# Patient Record
Sex: Male | Born: 1975 | Race: White | Hispanic: No | Marital: Married | Smoking: Former smoker
Health system: Southern US, Community
[De-identification: ages and names within clinical notes are randomized; demographics above are authoritative.]

## PROBLEM LIST (undated history)

## (undated) DIAGNOSIS — K219 Gastro-esophageal reflux disease without esophagitis: Secondary | ICD-10-CM

## (undated) DIAGNOSIS — K5792 Diverticulitis of intestine, part unspecified, without perforation or abscess without bleeding: Secondary | ICD-10-CM

## (undated) DIAGNOSIS — J309 Allergic rhinitis, unspecified: Secondary | ICD-10-CM

## (undated) DIAGNOSIS — R5383 Other fatigue: Secondary | ICD-10-CM

## (undated) DIAGNOSIS — R51 Headache: Secondary | ICD-10-CM

## (undated) DIAGNOSIS — F419 Anxiety disorder, unspecified: Secondary | ICD-10-CM

## (undated) DIAGNOSIS — R519 Headache, unspecified: Secondary | ICD-10-CM

## (undated) HISTORY — PX: VASECTOMY: SHX75

## (undated) HISTORY — DX: Allergic rhinitis, unspecified: J30.9

## (undated) HISTORY — DX: Headache: R51

## (undated) HISTORY — DX: Anxiety disorder, unspecified: F41.9

## (undated) HISTORY — DX: Gastro-esophageal reflux disease without esophagitis: K21.9

## (undated) HISTORY — DX: Headache, unspecified: R51.9

## (undated) HISTORY — DX: Diverticulitis of intestine, part unspecified, without perforation or abscess without bleeding: K57.92

## (undated) HISTORY — DX: Other fatigue: R53.83

---

## 2006-12-18 ENCOUNTER — Emergency Department (HOSPITAL_COMMUNITY): Admission: EM | Admit: 2006-12-18 | Discharge: 2006-12-18 | Payer: Self-pay | Admitting: Emergency Medicine

## 2007-07-01 ENCOUNTER — Ambulatory Visit (HOSPITAL_COMMUNITY): Admission: RE | Admit: 2007-07-01 | Discharge: 2007-07-01 | Payer: Self-pay | Admitting: Internal Medicine

## 2008-07-27 ENCOUNTER — Encounter: Admission: RE | Admit: 2008-07-27 | Discharge: 2008-07-27 | Payer: Self-pay | Admitting: Allergy and Immunology

## 2011-04-30 LAB — POCT CARDIAC MARKERS
CKMB, poc: <1 — ABNORMAL LOW
Myoglobin, poc: 50.9
Operator id: 151321

## 2011-04-30 LAB — I-STAT 8, (EC8 V) (CONVERTED LAB)
Bicarbonate: 29.6 — ABNORMAL HIGH
Glucose, Bld: 108 — ABNORMAL HIGH
Hemoglobin: 17.7 — ABNORMAL HIGH
Operator id: 151321
Sodium: 140
TCO2: 31
pCO2, Ven: 51.6 — ABNORMAL HIGH

## 2011-08-14 ENCOUNTER — Other Ambulatory Visit: Payer: Self-pay | Admitting: Occupational Medicine

## 2011-08-14 ENCOUNTER — Ambulatory Visit
Admission: RE | Admit: 2011-08-14 | Discharge: 2011-08-14 | Disposition: A | Discharge status: Home / Self Care | Payer: No Typology Code available for payment source | Source: Ambulatory Visit | Attending: Occupational Medicine | Admitting: Occupational Medicine

## 2011-08-14 DIAGNOSIS — M25561 Pain in right knee: Secondary | ICD-10-CM

## 2013-05-22 ENCOUNTER — Encounter: Payer: Self-pay | Admitting: Gastroenterology

## 2013-06-23 ENCOUNTER — Encounter: Payer: Self-pay | Admitting: Gastroenterology

## 2013-06-23 ENCOUNTER — Ambulatory Visit (INDEPENDENT_AMBULATORY_CARE_PROVIDER_SITE_OTHER): Payer: 59 | Admitting: Gastroenterology

## 2013-06-23 VITALS — BP 100/70 | HR 80 | Ht 69.0 in | Wt 156.2 lb

## 2013-06-23 DIAGNOSIS — K921 Melena: Secondary | ICD-10-CM

## 2013-06-23 DIAGNOSIS — K219 Gastro-esophageal reflux disease without esophagitis: Secondary | ICD-10-CM

## 2013-06-23 DIAGNOSIS — R1032 Left lower quadrant pain: Secondary | ICD-10-CM

## 2013-06-23 MED ORDER — PEG-KCL-NACL-NASULF-NA ASC-C 100 G PO SOLR: 1.0000 | Freq: Once | ORAL | Status: DC

## 2013-06-23 NOTE — Progress Notes (Signed)
    History of Present Illness: This is a 37 year old white male who relates the onset of severe left lower quadrant pain in October. He was treated with a course of Cipro and Flagyl and his pain resolved. Following that he had an episode of clots per rectum and has small amounts of bright red blood at other times bowel movements. Office records from Dr. Wylene Simmer were reviewed including a CBC at the time of his acute lower quadrant pain showing white blood cell count of 14.9 and a hemoglobin of 15.8. Patient has occasional loose stools when he takes dexlansoprazole more frequently. He has been treated for  GERD for approximately 10 years. Denies weight loss, constipation, change in stool caliber, melena, hematochezia, nausea, vomiting, dysphagia, chest pain.  No Known Allergies No outpatient prescriptions prior to visit.   No facility-administered medications prior to visit.   Past Medical History  Diagnosis Date  . GERD (gastroesophageal reflux disease)   . AR (allergic rhinitis)   . Anxiety   . Diverticulitis   . Fatigue   . Severe frontal headaches    Past Surgical History  Procedure Laterality Date  . Vasectomy     History   Social History  . Marital Status: Married    Spouse Name: N/A    Number of Children: 2  . Years of Education: N/A   Occupational History  . Maintenence Worker Unemployed   Social History Main Topics  . Smoking status: Former Smoker    Quit date: 07/13/2006  . Smokeless tobacco: Never Used  . Alcohol Use: No  . Drug Use: No  . Sexual Activity: None   Other Topics Concern  . None   Social History Narrative  . None   Family History  Problem Relation Age of Onset  . Diverticulitis Mother     19's  . Lung cancer Maternal Grandmother   . Melanoma Maternal Grandfather     Review of Systems: Pertinent positive and negative review of systems were noted in the above HPI section. All other review of systems were otherwise negative.   Physical  Exam: General: Well developed , well nourished, no acute distress Head: Normocephalic and atraumatic Eyes:  sclerae anicteric, EOMI Ears: Normal auditory acuity Mouth: No deformity or lesions Neck: Supple, no masses or thyromegaly Lungs: Clear throughout to auscultation Heart: Regular rate and rhythm; no murmurs, rubs or bruits Abdomen: Soft, non tender and non distended. No masses, hepatosplenomegaly or hernias noted. Normal Bowel sounds Rectal: Deferred to colonoscopy  Musculoskeletal: Symmetrical with no gross deformities  Skin: No lesions on visible extremities Pulses:  Normal pulses noted Extremities: No clubbing, cyanosis, edema or deformities noted Neurological: Alert oriented x 4, grossly nonfocal Cervical Nodes:  No significant cervical adenopathy Inguinal Nodes: No significant inguinal adenopathy Psychological:  Alert and cooperative. Normal mood and affect  Assessment and Recommendations:  1. Presumed episode of diverticulitis in late October which responded well to a course of Cipro and Flagyl. Schedule colonoscopy.  2.  Hematochezia. Rule out hemorrhoids, diverticulosis, colorectal neoplasms.  The risks, benefits, and alternatives to colonoscopy with possible biopsy and possible polypectomy were discussed with the patient and they consent to proceed.   3. GERD. Standard antireflux measures and continue dexlansoprazole. Although he has diarrhea that seems to be related to dexlansoprazole He prefers not to change to another PPI as others have not been effective.

## 2013-06-23 NOTE — Patient Instructions (Addendum)
You have been scheduled for a colonoscopy with propofol. Please follow written instructions given to you at your visit today.  Please pick up your prep kit at the pharmacy within the next 1-3 days. If you use inhalers (even only as needed), please bring them with you on the day of your procedure.  Thank you for choosing me and [facility].  Venita Lick. Pleas Koch., MD., Clementeen Graham  cc: Guerry Bruin, MD

## 2013-08-08 ENCOUNTER — Encounter: Payer: Self-pay | Admitting: Gastroenterology

## 2013-08-08 ENCOUNTER — Ambulatory Visit (AMBULATORY_SURGERY_CENTER): Payer: 59 | Admitting: Gastroenterology

## 2013-08-08 VITALS — BP 114/66 | HR 60 | Temp 98.1°F | Resp 12 | Ht 69.0 in | Wt 156.0 lb

## 2013-08-08 DIAGNOSIS — D126 Benign neoplasm of colon, unspecified: Secondary | ICD-10-CM

## 2013-08-08 DIAGNOSIS — K921 Melena: Secondary | ICD-10-CM

## 2013-08-08 MED ORDER — SODIUM CHLORIDE 0.9 % IV SOLN: 500.0000 mL | INTRAVENOUS | Status: DC

## 2013-08-08 NOTE — Patient Instructions (Signed)

## 2013-08-08 NOTE — Progress Notes (Signed)
Called to room to assist during endoscopic procedure.  Patient ID and intended procedure confirmed with present staff. Received instructions for my participation in the procedure from the performing physician.  

## 2013-08-08 NOTE — Op Note (Signed)
[facility]  [address] [place], [id number]   COLONOSCOPY PROCEDURE REPORT  PATIENT: [name], Justin  MR#: [id number] BIRTHDATE: [birth year] , 37  yrs. old GENDER: Male ENDOSCOPIST: Ladene Artist, MD, [facility] REFERRED [id number] Tisovec, M.D. PROCEDURE DATE:  08/08/2013 PROCEDURE:   Colonoscopy with snare polypectomy First Screening Colonoscopy - Avg.  risk and is 50 yrs.  old or older - No.  Prior Negative Screening - Now for repeat screening. N/A  History of Adenoma - Now for follow-up colonoscopy & has been > or = to 3 yrs.  N/A  Polyps Removed Today? Yes. ASA CLASS:   Class II INDICATIONS:hematochezia and abdominal pain in the lower left quadrant. MEDICATIONS: MAC sedation, administered by CRNA and propofol (Diprivan) 350mg  IV DESCRIPTION OF PROCEDURE:   After the risks benefits and alternatives of the procedure were thoroughly explained, informed consent was obtained.  A digital rectal exam revealed no abnormalities of the rectum.   The LB [id number]  endoscope was introduced through the anus and advanced to the terminal ileum which was intubated for a short distance. No adverse events experienced.   The quality of the prep was good, using MoviPrep The instrument was then slowly withdrawn as the colon was fully examined.  COLON FINDINGS: A sessile polyp measuring 5 mm in size was found in the transverse colon.  A polypectomy was performed with a cold snare.  The resection was complete and the polyp tissue was completely retrieved.   The mucosa appeared normal in the terminal ileum.   The colon was otherwise normal.  There was no diverticulosis, inflammation, polyps or cancers unless previously stated.  Retroflexed views revealed small internal hemorrhoids. The time to cecum=1 minutes 02 seconds.  Withdrawal time=10 minutes 16 seconds.  The scope was withdrawn and the procedure completed. COMPLICATIONS: There were no complications.  ENDOSCOPIC  IMPRESSION: 1.   Sessile polyp measuring 5 mm in the transverse colon; polypectomy performed with a cold snare 2.   Normal mucosa in the terminal ileum 3.   Small internal hemorrhoids  RECOMMENDATIONS: 1. Repeat colonoscopy in 5 years if polyp adenomatous; otherwise at age 61 years for routine CRC screening 2.  Prior illness likely a self limited colitis-suspected infectious  eSigned:  Ladene Artist, MD, [facility] 08/08/2013 3:35 PM

## 2013-08-09 ENCOUNTER — Telehealth: Payer: Self-pay | Admitting: *Deleted

## 2013-08-09 NOTE — Telephone Encounter (Signed)
  Follow up Call-  Call back number 08/08/2013  Post procedure Call Back phone  # [phone]  Permission to leave phone message Yes     Patient questions:  Do you have a fever, pain , or abdominal swelling? no Pain Score  0 *  Have you tolerated food without any problems? yes  Have you been able to return to your normal activities? yes  Do you have any questions about your discharge instructions: Diet   no Medications  no Follow up visit  no  Do you have questions or concerns about your Care? no  Actions: * If pain score is 4 or above: No action needed, pain <4.

## 2013-08-16 ENCOUNTER — Encounter: Payer: Self-pay | Admitting: Gastroenterology

## 2016-08-21 DIAGNOSIS — K130 Diseases of lips: Secondary | ICD-10-CM | POA: Diagnosis not present

## 2017-06-01 DIAGNOSIS — Z0000 Encounter for general adult medical examination without abnormal findings: Secondary | ICD-10-CM | POA: Diagnosis not present

## 2017-06-01 DIAGNOSIS — Z125 Encounter for screening for malignant neoplasm of prostate: Secondary | ICD-10-CM | POA: Diagnosis not present

## 2017-06-11 DIAGNOSIS — K219 Gastro-esophageal reflux disease without esophagitis: Secondary | ICD-10-CM | POA: Diagnosis not present

## 2017-06-11 DIAGNOSIS — J4520 Mild intermittent asthma, uncomplicated: Secondary | ICD-10-CM | POA: Diagnosis not present

## 2017-06-11 DIAGNOSIS — Z0000 Encounter for general adult medical examination without abnormal findings: Secondary | ICD-10-CM | POA: Diagnosis not present

## 2017-06-11 DIAGNOSIS — Z1389 Encounter for screening for other disorder: Secondary | ICD-10-CM | POA: Diagnosis not present

## 2017-10-09 ENCOUNTER — Emergency Department (HOSPITAL_COMMUNITY)
Admission: EM | Admit: 2017-10-09 | Discharge: 2017-10-09 | Disposition: A | Discharge status: Home / Self Care | Payer: 59 | Attending: Emergency Medicine | Admitting: Emergency Medicine

## 2017-10-09 ENCOUNTER — Encounter (HOSPITAL_COMMUNITY): Payer: Self-pay | Admitting: Emergency Medicine

## 2017-10-09 ENCOUNTER — Emergency Department (HOSPITAL_COMMUNITY): Payer: 59

## 2017-10-09 ENCOUNTER — Other Ambulatory Visit: Payer: Self-pay

## 2017-10-09 DIAGNOSIS — M79642 Pain in left hand: Secondary | ICD-10-CM | POA: Diagnosis not present

## 2017-10-09 DIAGNOSIS — Y9389 Activity, other specified: Secondary | ICD-10-CM | POA: Insufficient documentation

## 2017-10-09 DIAGNOSIS — S6982XA Other specified injuries of left wrist, hand and finger(s), initial encounter: Secondary | ICD-10-CM | POA: Diagnosis not present

## 2017-10-09 DIAGNOSIS — F419 Anxiety disorder, unspecified: Secondary | ICD-10-CM | POA: Insufficient documentation

## 2017-10-09 DIAGNOSIS — Y92009 Unspecified place in unspecified non-institutional (private) residence as the place of occurrence of the external cause: Secondary | ICD-10-CM | POA: Diagnosis not present

## 2017-10-09 DIAGNOSIS — T148XXA Other injury of unspecified body region, initial encounter: Secondary | ICD-10-CM | POA: Diagnosis not present

## 2017-10-09 DIAGNOSIS — Z79899 Other long term (current) drug therapy: Secondary | ICD-10-CM | POA: Diagnosis not present

## 2017-10-09 DIAGNOSIS — Z87891 Personal history of nicotine dependence: Secondary | ICD-10-CM | POA: Diagnosis not present

## 2017-10-09 DIAGNOSIS — Y998 Other external cause status: Secondary | ICD-10-CM | POA: Diagnosis not present

## 2017-10-09 DIAGNOSIS — Z23 Encounter for immunization: Secondary | ICD-10-CM | POA: Insufficient documentation

## 2017-10-09 DIAGNOSIS — W3189XA Contact with other specified machinery, initial encounter: Secondary | ICD-10-CM | POA: Diagnosis not present

## 2017-10-09 DIAGNOSIS — M79641 Pain in right hand: Secondary | ICD-10-CM | POA: Diagnosis not present

## 2017-10-09 DIAGNOSIS — T704XXA Effects of high-pressure fluids, initial encounter: Secondary | ICD-10-CM | POA: Diagnosis not present

## 2017-10-09 DIAGNOSIS — W298XXA Contact with other powered powered hand tools and household machinery, initial encounter: Secondary | ICD-10-CM

## 2017-10-09 DIAGNOSIS — S61211A Laceration without foreign body of left index finger without damage to nail, initial encounter: Secondary | ICD-10-CM | POA: Insufficient documentation

## 2017-10-09 MED ORDER — HYDROCODONE-ACETAMINOPHEN 5-325 MG PO TABS: 2.0000 | ORAL_TABLET | ORAL | 0 refills | Status: DC | PRN

## 2017-10-09 MED ORDER — AMOXICILLIN-POT CLAVULANATE 875-125 MG PO TABS: 1.0000 | ORAL_TABLET | Freq: Two times a day (BID) | ORAL | 0 refills | Status: DC

## 2017-10-09 MED ORDER — MORPHINE SULFATE (PF) 4 MG/ML IV SOLN
2.0000 mg | Freq: Once | INTRAVENOUS | Status: AC
  Administered 2017-10-09: 2 mg via INTRAVENOUS
  Filled 2017-10-09: qty 1

## 2017-10-09 MED ORDER — TETANUS-DIPHTH-ACELL PERTUSSIS 5-2.5-18.5 LF-MCG/0.5 IM SUSP
0.5000 mL | Freq: Once | INTRAMUSCULAR | Status: AC
  Administered 2017-10-09: 0.5 mL via INTRAMUSCULAR
  Filled 2017-10-09: qty 0.5

## 2017-10-09 NOTE — ED Notes (Signed)
ED Provider at bedside. 

## 2017-10-09 NOTE — Discharge Instructions (Signed)
Please return for any problem.  Keep wound clean and dry.  Take antibiotics and pain medication as prescribed.  Follow-up with Dr. Fredna Dow as instructed.

## 2017-10-09 NOTE — ED Provider Notes (Addendum)
[facility] EMERGENCY DEPARTMENT Provider Note   CSN: [id number] Arrival date & time: 10/09/17  1545     History   Chief Complaint Chief Complaint  Patient presents with  . Laceration    HPI Justin [name] is a 42 y.o. male.  42 year old male without significant prior medical history presents with complaint of laceration to the left index finger.  Patient reports that he was attempting to change the tips on his home pressure washer when the pressure washer triggered.  He has an injection injury to the base of the left index finger.  Reports that the pressure washer only had water running through it.  There was no detergents added to the flow.  He reports a very brief exposure to the pressure.  He denies current pain.  He is unsure of his last tetanus.  He received a dose of pain medication  enroute by EMS.  He denies numbness or tingling to the left hand.  There is no active bleeding at the time of my evaluation.  The history is provided by the patient.  Laceration   The incident occurred less than 1 hour ago. Pain location: Palmar aspect at the base of the left index finger. Size: 0.5cm. Injury mechanism: Home pressure washer. The pain is at a severity of 0/10. The patient is experiencing no pain. The pain has been improving since onset. His tetanus status is unknown.    Past Medical History:  Diagnosis Date  . Anxiety   . AR (allergic rhinitis)   . Diverticulitis   . Fatigue   . GERD (gastroesophageal reflux disease)   . Severe frontal headaches     There are no active problems to display for this patient.   Past Surgical History:  Procedure Laterality Date  . VASECTOMY          Home Medications    Prior to Admission medications   Medication Sig Start Date End Date Taking? Authorizing Provider  Budesonide-Formoterol Fumarate (SYMBICORT IN) Inhale into the lungs.    [provider]  DEXILANT 60 MG capsule  05/12/13   [provider]    Family History Family History  Problem Relation Age of Onset  . Diverticulitis Mother        65's  . Lung cancer Maternal Grandmother   . Melanoma Maternal Grandfather     Social History Social History   Tobacco Use  . Smoking status: Former Smoker    Last attempt to quit: 07/13/2006    Years since quitting: 11.2  . Smokeless tobacco: Never Used  Substance Use Topics  . Alcohol use: No  . Drug use: No     Allergies   Patient has no known allergies.   Review of Systems Review of Systems  Skin:       Laceration  All other systems reviewed and are negative.    Physical Exam Updated Vital Signs SpO2 99%   Physical Exam  Constitutional: He is oriented to person, place, and time. He appears well-developed and well-nourished. No distress.  HENT:  Head: Normocephalic and atraumatic.  Mouth/Throat: Oropharynx is clear and moist.  Eyes: Pupils are equal, round, and reactive to light. Conjunctivae and EOM are normal.  Neck: Normal range of motion. Neck supple.  Cardiovascular: Normal rate, regular rhythm and normal heart sounds.  Pulmonary/Chest: Effort normal and breath sounds normal. No respiratory distress.  Abdominal: Soft. He exhibits no distension. There is no tenderness.  Musculoskeletal: Normal range of motion. He  exhibits no edema or deformity.  0.5 cm laceration to the base of the left index finger along the palmar aspect. Distal right upper extremity is neurovascularly intact.  No active bleeding noted from laceration.  No appreciable foreign body. Mild crepitus noted around the left index finger and dorsal aspect of the left hand.   Please see attached photo.  Neurological: He is alert and oriented to person, place, and time.  Skin: Skin is warm and dry.  Nursing note and vitals reviewed.         ED Treatments / Results  Labs (all labs ordered are listed, but only abnormal results are displayed) Labs Reviewed - No data to  display  EKG None  Radiology Dg Hand Complete Left  Result Date: 10/09/2017 CLINICAL DATA:  Acute LEFT hand pain and swelling following pressure washer accident today. Initial encounter. EXAM: LEFT HAND - COMPLETE 3+ VIEW COMPARISON:  None. FINDINGS: Soft tissue swelling noted with gas in the soft tissues of the index finger, middle finger, proximal thumb and mid-lateral hand. There is no evidence fracture, subluxation or dislocation. IMPRESSION: Soft tissue swelling with gas.  No acute bony abnormality. Electronically Signed   By: Margarette Canada M.D.   On: 10/09/2017 16:46    Procedures Procedures (including critical care time)  Medications Ordered in ED Medications  Tdap (BOOSTRIX) injection 0.5 mL (has no administration in time range)     Initial Impression / Assessment and Plan / ED Course  I have reviewed the triage vital signs and the nursing notes.  Pertinent labs & imaging results that were available during my care of the patient were reviewed by me and considered in my medical decision making (see chart for details).     1640 Dr. Fredna Dow oncall for hand - He is currently in the OR - He is aware of case, per scrub nurse, and will call back shortly.  1700 Dr. Fredna Dow aware of case - will see patient in the ED.    1730 Patient seen by Dr. Fredna Dow - patient to be discharged home.    MDM  Screen Complete  Patient is presenting with a pressure washer injection injury to the left hand.  Patient seen in the ED by our hand specialist oncall Dr. Fredna Dow.  Dr. Fredna Dow feels that the patient can be discharged home with prophylactic antibiotics and pain medications.  X-ray does not reveal foreign body or acute fracture.  Patient understands the need for close follow-up.  Strict return precautions given and understood - especially increased pain, fever, or increased redness.  Final Clinical Impressions(s) / ED Diagnoses   Final diagnoses:  High-pressure injection injury of finger of left  hand, initial encounter  Laceration of left index finger without foreign body without damage to nail, initial encounter    ED Discharge Orders        Ordered    amoxicillin-clavulanate (AUGMENTIN) 875-125 MG tablet  Every 12 hours     10/09/17 1738    HYDROcodone-acetaminophen (NORCO/VICODIN) 5-325 MG tablet  Every 4 hours PRN     10/09/17 1738       Valarie Merino, MD 10/09/17 1740    Valarie Merino, MD 10/09/17 [id number]

## 2017-10-09 NOTE — ED Triage Notes (Signed)
Pt arrives from home via [facility] reporting lac to L hand from pressure washer.  EMS reports bleeding minimal and controlled at this time.  EMS reports giving total of 200 mcg Fentanyl and 4 mg zofran IV en route. NAD noted on arrival. Clean dressing in place.

## 2017-10-09 NOTE — Consult Note (Signed)
Justin [name] is an 42 y.o. male.   Chief Complaint: Left index finger injection injury HPI: 42 year old right-hand dominant male states he was changing the tips on his pressure washer when he unintentionally discharged into the left index finger.  This occurred earlier today.  The pressure washer was loaded with water with no detergent or other substance.  Notes a laceration at the base of the finger.  States pain is controlled now.  He describes a intermittent stabbing pain of 1 out of 10 severity.  He has noted no alleviating or aggravating factors.  There is an associated wound.  Case discussed with Dene Gentry, MD and his note from 10/09/2017 reviewed. Xrays viewed and interpreted by me: 3 views left hand show no fracture dislocation or radiopaque foreign body.  There is evidence of the water in the soft tissues. Labs reviewed: None  Allergies: No Known Allergies  Past Medical History:  Diagnosis Date  . Anxiety   . AR (allergic rhinitis)   . Diverticulitis   . Fatigue   . GERD (gastroesophageal reflux disease)   . Severe frontal headaches     Past Surgical History:  Procedure Laterality Date  . VASECTOMY      Family History: Family History  Problem Relation Age of Onset  . Diverticulitis Mother        47's  . Lung cancer Maternal Grandmother   . Melanoma Maternal Grandfather     Social History:   reports that he quit smoking about 11 years ago. He has never used smokeless tobacco. He reports that he does not drink alcohol or use drugs.  Medications:  (Not in a hospital admission)  No results found for this or any previous visit (from the past 48 hour(s)).  Dg Hand Complete Left  Result Date: 10/09/2017 CLINICAL DATA:  Acute LEFT hand pain and swelling following pressure washer accident today. Initial encounter. EXAM: LEFT HAND - COMPLETE 3+ VIEW COMPARISON:  None. FINDINGS: Soft tissue swelling noted with gas in the soft tissues of the index finger, middle  finger, proximal thumb and mid-lateral hand. There is no evidence fracture, subluxation or dislocation. IMPRESSION: Soft tissue swelling with gas.  No acute bony abnormality. Electronically Signed   By: Margarette Canada M.D.   On: 10/09/2017 16:46     A comprehensive review of systems was negative. Review of Systems: No fevers, chills, night sweats, chest pain, shortness of breath, nausea, vomiting, diarrhea, constipation, easy bleeding or bruising, headaches, dizziness, vision changes, fainting.   SpO2 99 %.  General appearance: alert, cooperative and appears stated age Head: Normocephalic, without obvious abnormality, atraumatic Neck: supple, symmetrical, trachea midline Extremities: Intact sensation and capillary refill to all fingertips.  He has good range of motion in digits.  The left index finger there is a laceration just distal to the proximal finger flexion crease with some fat protruding.  No swelling in the hand or digit.  He can hold the finger flexed against resistance without pain.  There is crepitance in the index finger and in the palm over to the thumb index webspace and ulnar side of the thumb.  There is some crepitance on the dorsum of the index finger.  There is no crepitance in the proximal palmar forearm.  Brisk capillary refill in the fingertip. Pulses: 2+ and symmetric Skin: Skin color, texture, turgor normal. No rashes or lesions Neurologic: Grossly normal Incision/Wound: As above  Assessment/Plan High-pressure injection injury of left index finger with water.  I discussed with Mr.  Hainsworth his wife who is with him the nature of the injury.  At this time would recommend antibiotics and pain medications with local wound care and elevation.  He will keep an eye on his finger.  If he notes any swelling, discoloration, increasing pain that is not controlled he will let us know.  I will plan to see him in 2-3 days in the office.  Pain meds and antibiotics per the emergency  department.  He agrees with plan of care.  Dolton Shaker R 10/09/2017, 5:50 PM

## 2017-10-11 DIAGNOSIS — S6982XA Other specified injuries of left wrist, hand and finger(s), initial encounter: Secondary | ICD-10-CM | POA: Diagnosis not present

## 2017-10-20 DIAGNOSIS — S6982XA Other specified injuries of left wrist, hand and finger(s), initial encounter: Secondary | ICD-10-CM | POA: Diagnosis not present

## 2018-06-17 DIAGNOSIS — Z0000 Encounter for general adult medical examination without abnormal findings: Secondary | ICD-10-CM | POA: Diagnosis not present

## 2018-06-17 DIAGNOSIS — Z125 Encounter for screening for malignant neoplasm of prostate: Secondary | ICD-10-CM | POA: Diagnosis not present

## 2018-06-17 DIAGNOSIS — R82998 Other abnormal findings in urine: Secondary | ICD-10-CM | POA: Diagnosis not present

## 2018-06-24 DIAGNOSIS — J4520 Mild intermittent asthma, uncomplicated: Secondary | ICD-10-CM | POA: Diagnosis not present

## 2018-06-24 DIAGNOSIS — Z0000 Encounter for general adult medical examination without abnormal findings: Secondary | ICD-10-CM | POA: Diagnosis not present

## 2018-06-24 DIAGNOSIS — K219 Gastro-esophageal reflux disease without esophagitis: Secondary | ICD-10-CM | POA: Diagnosis not present

## 2018-06-24 DIAGNOSIS — Z1389 Encounter for screening for other disorder: Secondary | ICD-10-CM | POA: Diagnosis not present

## 2018-06-24 DIAGNOSIS — E7800 Pure hypercholesterolemia, unspecified: Secondary | ICD-10-CM | POA: Diagnosis not present

## 2018-07-21 ENCOUNTER — Encounter: Payer: Self-pay | Admitting: Gastroenterology

## 2018-08-09 ENCOUNTER — Ambulatory Visit: Payer: 59 | Admitting: Gastroenterology

## 2018-08-09 ENCOUNTER — Encounter: Payer: Self-pay | Admitting: Gastroenterology

## 2018-08-09 VITALS — BP 116/76 | HR 84 | Ht 70.0 in | Wt 156.0 lb

## 2018-08-09 DIAGNOSIS — R1013 Epigastric pain: Secondary | ICD-10-CM

## 2018-08-09 DIAGNOSIS — K625 Hemorrhage of anus and rectum: Secondary | ICD-10-CM

## 2018-08-09 DIAGNOSIS — K219 Gastro-esophageal reflux disease without esophagitis: Secondary | ICD-10-CM | POA: Diagnosis not present

## 2018-08-09 NOTE — Progress Notes (Signed)
History of Present Illness: This is a 43 year old male referred by Tisovec, Fransico Him, MD for the evaluation of GERD, epigastric pain, rectal bleeding.  He relates intermittent epigastric pain that has responded to intermittent Dexilant usage.  He states he has had intermittent reflux symptoms for about 15 years.  Epigastric pain increased in frequency and severity about 6 weeks ago so he used Dexilant more often and his symptoms have returned to their more mild baseline over the past 2 weeks.  He relates a few episodes when the pain was more severe last month and was associated with diaphoresis.  In addition he has had intermittent problems with small-volume rectal bleeding often when stools are harder or mildly constipated.  This has been an intermittent problem since 2014.  He underwent colonoscopy for evaluation of these symptoms in January 2015 showing internal hemorrhoids.  His rectal bleeding was occurring more frequently last month but over the past 2 weeks he has not noted any rectal bleeding. Denies weight loss, diarrhea, change in stool caliber, melena, nausea, vomiting, dysphagia, chest pain.   Colonoscopy 07/2013: internal hemorrhoids, small polyp (benign lymphoid tissue)    No Known Allergies Outpatient Medications Prior to Visit  Medication Sig Dispense Refill  . DEXILANT 60 MG capsule Take 60 mg by mouth daily as needed (for GERD symptoms).     Marland Kitchen ibuprofen (ADVIL,MOTRIN) 200 MG tablet Take 200-400 mg by mouth every 6 (six) hours as needed (for sinus headaches).    Marland Kitchen oxymetazoline (AFRIN) 0.05 % nasal spray Place 1 spray into both nostrils daily as needed for congestion.    Marland Kitchen amoxicillin-clavulanate (AUGMENTIN) 875-125 MG tablet Take 1 tablet by mouth every 12 (twelve) hours. 14 tablet 0  . HYDROcodone-acetaminophen (NORCO/VICODIN) 5-325 MG tablet Take 2 tablets by mouth every 4 (four) hours as needed. 10 tablet 0   No facility-administered medications prior to visit.    Past  Medical History:  Diagnosis Date  . Anxiety   . AR (allergic rhinitis)   . Diverticulitis   . Fatigue   . GERD (gastroesophageal reflux disease)   . Severe frontal headaches    Past Surgical History:  Procedure Laterality Date  . VASECTOMY     Social History   Socioeconomic History  . Marital status: Married    Spouse name: Not on file  . Number of children: 2  . Years of education: Not on file  . Highest education level: Not on file  Occupational History  . Occupation: Hydrologist: UNEMPLOYED  Social Needs  . Financial resource strain: Not on file  . Food insecurity:    Worry: Not on file    Inability: Not on file  . Transportation needs:    Medical: Not on file    Non-medical: Not on file  Tobacco Use  . Smoking status: Former Smoker    Last attempt to quit: 07/13/2006    Years since quitting: 12.0  . Smokeless tobacco: Never Used  Substance and Sexual Activity  . Alcohol use: No  . Drug use: No  . Sexual activity: Yes    Partners: Female  Lifestyle  . Physical activity:    Days per week: Not on file    Minutes per session: Not on file  . Stress: Not on file  Relationships  . Social connections:    Talks on phone: Not on file    Gets together: Not on file    Attends religious service: Not on  file    Active member of club or organization: Not on file    Attends meetings of clubs or organizations: Not on file    Relationship status: Not on file  Other Topics Concern  . Not on file  Social History Narrative  . Not on file   Family History  Problem Relation Age of Onset  . Diverticulitis Mother        68's  . Lung cancer Maternal Grandmother   . Melanoma Maternal Grandfather        Review of Systems: Pertinent positive and negative review of systems were noted in the above HPI section. All other review of systems were otherwise negative.    Physical Exam: General: Well developed, well nourished, no acute distress Head:  Normocephalic and atraumatic Eyes:  sclerae anicteric, EOMI Ears: Normal auditory acuity Mouth: No deformity or lesions Neck: Supple, no masses or thyromegaly Lungs: Clear throughout to auscultation Heart: Regular rate and rhythm; no murmurs, rubs or bruits Abdomen: Soft, mild epigastric tenderness and non distended. No masses, hepatosplenomegaly or hernias noted. Normal Bowel sounds Rectal: No lesions, no tenderness, Hemoccult negative brown stool in the vault Musculoskeletal: Symmetrical with no gross deformities  Skin: No lesions on visible extremities Pulses:  Normal pulses noted Extremities: No clubbing, cyanosis, edema or deformities noted Neurological: Alert oriented x 4, grossly nonfocal Cervical Nodes:  No significant cervical adenopathy Inguinal Nodes: No significant inguinal adenopathy Psychological:  Alert and cooperative. Normal mood and affect   Assessment and Recommendations:  1.  GERD. Epigastric pain.  Rule out cholelithiasis, GERD, gastritis, ulcer.  Schedule abdominal ultrasound.  Schedule EGD. The risks (including bleeding, perforation, infection, missed lesions, medication reactions and possible hospitalization or surgery if complications occur), benefits, and alternatives to endoscopy with possible biopsy and possible dilation were discussed with the patient and they consent to proceed.  Minimize or avoid NSAID usage.  Take Dexilant 60 mg daily.  Follow antireflux measures.  2.  Intermittent rectal bleeding very likely secondary to internal hemorrhoids.  Mild intermittent constipation.  High-fiber diet and increase fluid intake long term.  Preparation H supp daily as needed for hemorrhoidal symptoms.  If rectal bleeding does not abate with these measures I advised repeat colonoscopy.     cc: Haywood Pao, MD [address] [place], [place] [address]

## 2018-08-09 NOTE — Addendum Note (Signed)
Addended by: Marzella Schlein on: 08/09/2018 01:43 PM   Modules accepted: Orders

## 2018-08-09 NOTE — Patient Instructions (Signed)
Patient advised to avoid spicy, acidic, citrus, chocolate, mints, fruit and fruit juices.  Limit the intake of caffeine, alcohol and Soda.  Don't exercise too soon after eating.  Don't lie down within 3-4 hours of eating.  Elevate the head of your bed.  You have been scheduled for an endoscopy. Please follow written instructions given to you at your visit today. If you use inhalers (even only as needed), please bring them with you on the day of your procedure. Your physician has requested that you go to www.startemmi.com and enter the access code given to you at your visit today. This web site gives a general overview about your procedure. However, you should still follow specific instructions given to you by our office regarding your preparation for the procedure.   You can use preparation H suppositories rectally daily as needed for hemorrhoidal symptoms.   Thank you for choosing me and [facility].  Pricilla Riffle. Dagoberto Ligas., MD., [facility]   High-Fiber Diet Fiber, also called dietary fiber, is a type of carbohydrate that is found in fruits, vegetables, whole grains, and beans. A high-fiber diet can have many health benefits. Your health care provider may recommend a high-fiber diet to help:  Prevent constipation. Fiber can make your bowel movements more regular.  Lower your cholesterol.  Relieve the following conditions: ? Swelling of veins in the anus (hemorrhoids). ? Swelling and irritation (inflammation) of specific areas of the digestive tract (uncomplicated diverticulosis). ? A problem of the large intestine (colon) that sometimes causes pain and diarrhea (irritable bowel syndrome, IBS).  Prevent overeating as part of a weight-loss plan.  Prevent heart disease, type 2 diabetes, and certain cancers. What is my plan? The recommended daily fiber intake in grams (g) includes:  38 g for men age 67 or younger.  30 g for men over age 10.  69 g for women age 2 or  younger.  21 g for women over age 36. You can get the recommended daily intake of dietary fiber by:  Eating a variety of fruits, vegetables, grains, and beans.  Taking a fiber supplement, if it is not possible to get enough fiber through your diet. What do I need to know about a high-fiber diet?  It is better to get fiber through food sources rather than from fiber supplements. There is not a lot of research about how effective supplements are.  Always check the fiber content on the nutrition facts label of any prepackaged food. Look for foods that contain 5 g of fiber or more per serving.  Talk with a diet and nutrition specialist (dietitian) if you have questions about specific foods that are recommended or not recommended for your medical condition, especially if those foods are not listed below.  Gradually increase how much fiber you consume. If you increase your intake of dietary fiber too quickly, you may have bloating, cramping, or gas.  Drink plenty of water. Water helps you to digest fiber. What are tips for following this plan?  Eat a wide variety of high-fiber foods.  Make sure that half of the grains that you eat each day are whole grains.  Eat breads and cereals that are made with whole-grain flour instead of refined flour or white flour.  Eat brown rice, bulgur wheat, or millet instead of white rice.  Start the day with a breakfast that is high in fiber, such as a cereal that contains 5 g of fiber or more per serving.  Use beans in place  of meat in soups, salads, and pasta dishes.  Eat high-fiber snacks, such as berries, raw vegetables, nuts, and popcorn.  Choose whole fruits and vegetables instead of processed forms like juice or sauce. What foods can I eat?  Fruits Berries. Pears. Apples. Oranges. Avocado. Prunes and raisins. Dried figs. Vegetables Sweet potatoes. Spinach. Kale. Artichokes. Cabbage. Broccoli. Cauliflower. Green peas. Carrots.  Squash. Grains Whole-grain breads. Multigrain cereal. Oats and oatmeal. Brown rice. Barley. Bulgur wheat. [place]. Quinoa. Bran muffins. Popcorn. Rye wafer crackers. Meats and other proteins Navy, kidney, and pinto beans. Soybeans. Split peas. Lentils. Nuts and seeds. Dairy Fiber-fortified yogurt. Beverages Fiber-fortified soy milk. Fiber-fortified orange juice. Other foods Fiber bars. The items listed above may not be a complete list of recommended foods and beverages. Contact a dietitian for more options. What foods are not recommended? Fruits Fruit juice. Cooked, strained fruit. Vegetables Fried potatoes. Canned vegetables. Well-cooked vegetables. Grains White bread. Pasta made with refined flour. White rice. Meats and other proteins Fatty cuts of meat. Fried chicken or fried fish. Dairy Milk. Yogurt. Cream cheese. Sour cream. Fats and oils Butters. Beverages Soft drinks. Other foods Cakes and pastries. The items listed above may not be a complete list of foods and beverages to avoid. Contact a dietitian for more information. Summary  Fiber is a type of carbohydrate. It is found in fruits, vegetables, whole grains, and beans.  There are many health benefits of eating a high-fiber diet, such as preventing constipation, lowering blood cholesterol, helping with weight loss, and reducing your risk of heart disease, diabetes, and certain cancers.  Gradually increase your intake of fiber. Increasing too fast can result in cramping, bloating, and gas. Drink plenty of water while you increase your fiber.  The best sources of fiber include whole fruits and vegetables, whole grains, nuts, seeds, and beans. This information is not intended to replace advice given to you by your health care provider. Make sure you discuss any questions you have with your health care provider. Document Released: 06/29/2005 Document Revised: 05/03/2017 Document Reviewed: 05/03/2017 Elsevier Interactive  Patient Education  2019 [employer].

## 2018-08-15 ENCOUNTER — Ambulatory Visit (HOSPITAL_COMMUNITY)
Admission: RE | Admit: 2018-08-15 | Discharge: 2018-08-15 | Disposition: A | Discharge status: Home / Self Care | Payer: 59 | Source: Ambulatory Visit | Attending: Gastroenterology | Admitting: Gastroenterology

## 2018-08-15 DIAGNOSIS — R1013 Epigastric pain: Secondary | ICD-10-CM | POA: Diagnosis present

## 2018-08-17 ENCOUNTER — Ambulatory Visit (AMBULATORY_SURGERY_CENTER): Payer: 59 | Admitting: Gastroenterology

## 2018-08-17 ENCOUNTER — Encounter: Payer: Self-pay | Admitting: Gastroenterology

## 2018-08-17 VITALS — BP 111/73 | HR 53 | Temp 97.5°F | Resp 13 | Ht 70.0 in | Wt 156.0 lb

## 2018-08-17 DIAGNOSIS — R1013 Epigastric pain: Secondary | ICD-10-CM

## 2018-08-17 DIAGNOSIS — K210 Gastro-esophageal reflux disease with esophagitis: Secondary | ICD-10-CM

## 2018-08-17 DIAGNOSIS — K219 Gastro-esophageal reflux disease without esophagitis: Secondary | ICD-10-CM

## 2018-08-17 MED ORDER — SODIUM CHLORIDE 0.9 % IV SOLN: 500.0000 mL | Freq: Once | INTRAVENOUS | Status: DC

## 2018-08-17 NOTE — Progress Notes (Signed)
Pt's states no medical or surgical changes since previsit or office visit. 

## 2018-08-17 NOTE — Progress Notes (Signed)
Report to PACU, RN, vss, BBS= Clear.  

## 2018-08-17 NOTE — Patient Instructions (Signed)
YOU HAD AN ENDOSCOPIC PROCEDURE TODAY AT THE [facility]:   Refer to the procedure report that was given to you for any specific questions about what was found during the examination.  If the procedure report does not answer your questions, please call your gastroenterologist to clarify.  If you requested that your care partner not be given the details of your procedure findings, then the procedure report has been included in a sealed envelope for you to review at your convenience later.  YOU SHOULD EXPECT: Some feelings of bloating in the abdomen. Passage of more gas than usual.  Walking can help get rid of the air that was put into your GI tract during the procedure and reduce the bloating. If you had a lower endoscopy (such as a colonoscopy or flexible sigmoidoscopy) you may notice spotting of blood in your stool or on the toilet paper. If you underwent a bowel prep for your procedure, you may not have a normal bowel movement for a few days.  Please Note:  You might notice some irritation and congestion in your nose or some drainage.  This is from the oxygen used during your procedure.  There is no need for concern and it should clear up in a day or so.  SYMPTOMS TO REPORT IMMEDIATELY:   Following upper endoscopy (EGD)  Vomiting of blood or coffee ground material  New chest pain or pain under the shoulder blades  Painful or persistently difficult swallowing  New shortness of breath  Fever of 100F or higher  Black, tarry-looking stools  For urgent or emergent issues, a gastroenterologist can be reached at any hour by calling [phone].   DIET:  We do recommend a small meal at first, but then you may proceed to your regular diet.  Drink plenty of fluids but you should avoid alcoholic beverages for 24 hours.  ACTIVITY:  You should plan to take it easy for the rest of today and you should NOT DRIVE or use heavy machinery until tomorrow (because of the sedation medicines used  during the test).    FOLLOW UP: Our staff will call the number listed on your records the next business day following your procedure to check on you and address any questions or concerns that you may have regarding the information given to you following your procedure. If we do not reach you, we will leave a message.  However, if you are feeling well and you are not experiencing any problems, there is no need to return our call.  We will assume that you have returned to your regular daily activities without incident.  If any biopsies were taken you will be contacted by phone or by letter within the next 1-3 weeks.  Please call us at [phone] if you have not heard about the biopsies in 3 weeks.    SIGNATURES/CONFIDENTIALITY: You and/or your care partner have signed paperwork which will be entered into your electronic medical record.  These signatures attest to the fact that that the information above on your After Visit Summary has been reviewed and is understood.  Full responsibility of the confidentiality of this discharge information lies with you and/or your care-partner. 

## 2018-08-17 NOTE — Op Note (Signed)
[facility] Patient Name: Justin [name] Procedure Date: 08/17/2018 9:48 AM MRN: [id number] Endoscopist: Ladene Artist , MD Age: 43 Referring MD:  Date of Birth: [birth year] Gender: Male Account #: [id number] Procedure:                Upper GI endoscopy Indications:              Epigastric abdominal pain, Gastroesophageal reflux                            disease Medicines:                Monitored Anesthesia Care Procedure:                Pre-Anesthesia Assessment:                           - Prior to the procedure, a History and Physical                            was performed, and patient medications and                            allergies were reviewed. The patient's tolerance of                            previous anesthesia was also reviewed. The risks                            and benefits of the procedure and the sedation                            options and risks were discussed with the patient.                            All questions were answered, and informed consent                            was obtained. Prior Anticoagulants: The patient has                            taken no previous anticoagulant or antiplatelet                            agents. ASA Grade Assessment: II - A patient with                            mild systemic disease. After reviewing the risks                            and benefits, the patient was deemed in                            satisfactory condition to undergo the procedure.  After obtaining informed consent, the endoscope was                            passed under direct vision. Throughout the                            procedure, the patient's blood pressure, pulse, and                            oxygen saturations were monitored continuously. The                            Model GIF-[id number]) scope was introduced                            through the mouth, and advanced to the second  part                            of duodenum. The upper GI endoscopy was                            accomplished without difficulty. The patient                            tolerated the procedure well. Scope In: Scope Out: Findings:                 LA Grade A (one or more mucosal breaks less than 5                            mm, not extending between tops of 2 mucosal folds)                            esophagitis with no bleeding was found at the                            gastroesophageal junction.                           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                           The exam of the esophagus was otherwise normal.                           The entire examined stomach was normal.                           The duodenal bulb and second portion of the                            duodenum  were normal. Complications:            No immediate complications. Estimated Blood Loss:     Estimated blood loss was minimal. Impression:               - LA Grade A reflux esophagitis.                           - Inlet patch, esophagus.                           - Normal stomach.                           - Normal duodenal bulb and second portion of the                            duodenum.                           - No specimens collected. Recommendation:           - Patient has a contact number available for                            emergencies. The signs and symptoms of potential                            delayed complications were discussed with the                            patient. Return to normal activities tomorrow.                            Written discharge instructions were provided to the                            patient.                           - Resume previous diet.                           - Follow antireflux measures.                           - Continue present medications.                            - Return to GI office in 2 months. Ladene Artist, MD 08/17/2018 10:03:27 AM This report has been signed electronically.

## 2018-08-18 ENCOUNTER — Telehealth: Payer: Self-pay

## 2018-08-18 NOTE — Telephone Encounter (Signed)
Left message on f/u call 

## 2018-08-18 NOTE — Telephone Encounter (Signed)
  Follow up Call-  Call back number 08/17/2018  Post procedure Call Back phone  # [phone]  Permission to leave phone message Yes  Some recent data might be hidden     Patient questions:  Do you have a fever, pain , or abdominal swelling? No. Pain Score  0 *  Have you tolerated food without any problems? Yes.    Have you been able to return to your normal activities? Yes.    Do you have any questions about your discharge instructions: Diet   No. Medications  No. Follow up visit  No.  Do you have questions or concerns about your Care? No.  Actions: * If pain score is 4 or above: No action needed, pain <4.

## 2018-11-02 ENCOUNTER — Encounter: Payer: Self-pay | Admitting: Gastroenterology

## 2019-08-21 ENCOUNTER — Telehealth: Payer: Self-pay

## 2019-08-21 DIAGNOSIS — K824 Cholesterolosis of gallbladder: Secondary | ICD-10-CM

## 2019-08-21 NOTE — Telephone Encounter (Signed)
Patient has been scheduled and notified of RUQ [place] scheduled for 09/07/19 7:00 at [facility]. Patient will need to arrive at 6:45 and be NPO after midnight.

## 2019-08-24 NOTE — Telephone Encounter (Signed)
Pt requested to reschedule [place] scheduled 09/07/19.

## 2019-08-24 NOTE — Telephone Encounter (Signed)
Patient contacted and provided the number to central scheduling to reschedule his [place]

## 2019-08-31 ENCOUNTER — Other Ambulatory Visit: Payer: Self-pay

## 2019-08-31 ENCOUNTER — Ambulatory Visit (HOSPITAL_COMMUNITY)
Admission: RE | Admit: 2019-08-31 | Discharge: 2019-08-31 | Disposition: A | Discharge status: Home / Self Care | Payer: 59 | Source: Ambulatory Visit | Attending: Gastroenterology | Admitting: Gastroenterology

## 2019-08-31 DIAGNOSIS — K824 Cholesterolosis of gallbladder: Secondary | ICD-10-CM | POA: Insufficient documentation

## 2019-09-01 ENCOUNTER — Telehealth: Payer: Self-pay

## 2019-09-01 NOTE — Telephone Encounter (Signed)
error 

## 2019-09-07 ENCOUNTER — Ambulatory Visit (HOSPITAL_COMMUNITY): Payer: 59

## 2019-11-15 DIAGNOSIS — I10 Essential (primary) hypertension: Secondary | ICD-10-CM | POA: Diagnosis not present

## 2019-11-15 DIAGNOSIS — E7849 Other hyperlipidemia: Secondary | ICD-10-CM | POA: Diagnosis not present

## 2019-11-15 DIAGNOSIS — Z0000 Encounter for general adult medical examination without abnormal findings: Secondary | ICD-10-CM | POA: Diagnosis not present

## 2019-11-16 DIAGNOSIS — R82998 Other abnormal findings in urine: Secondary | ICD-10-CM | POA: Diagnosis not present

## 2019-11-23 DIAGNOSIS — E7800 Pure hypercholesterolemia, unspecified: Secondary | ICD-10-CM | POA: Diagnosis not present

## 2019-11-23 DIAGNOSIS — I10 Essential (primary) hypertension: Secondary | ICD-10-CM | POA: Diagnosis not present

## 2019-11-23 DIAGNOSIS — K219 Gastro-esophageal reflux disease without esophagitis: Secondary | ICD-10-CM | POA: Diagnosis not present

## 2019-11-23 DIAGNOSIS — J4520 Mild intermittent asthma, uncomplicated: Secondary | ICD-10-CM | POA: Diagnosis not present

## 2019-11-23 DIAGNOSIS — Z1331 Encounter for screening for depression: Secondary | ICD-10-CM | POA: Diagnosis not present

## 2019-11-23 DIAGNOSIS — Z0000 Encounter for general adult medical examination without abnormal findings: Secondary | ICD-10-CM | POA: Diagnosis not present

## 2020-08-16 DIAGNOSIS — I10 Essential (primary) hypertension: Secondary | ICD-10-CM | POA: Diagnosis not present

## 2020-08-16 DIAGNOSIS — F419 Anxiety disorder, unspecified: Secondary | ICD-10-CM | POA: Diagnosis not present

## 2020-12-02 ENCOUNTER — Other Ambulatory Visit: Payer: Self-pay

## 2020-12-02 ENCOUNTER — Ambulatory Visit
Admission: RE | Admit: 2020-12-02 | Discharge: 2020-12-02 | Disposition: A | Discharge status: Home / Self Care | Payer: No Typology Code available for payment source | Source: Ambulatory Visit | Attending: Nurse Practitioner | Admitting: Nurse Practitioner

## 2020-12-02 ENCOUNTER — Other Ambulatory Visit: Payer: Self-pay | Admitting: Nurse Practitioner

## 2020-12-02 DIAGNOSIS — T1490XA Injury, unspecified, initial encounter: Secondary | ICD-10-CM

## 2021-06-10 DIAGNOSIS — Z1331 Encounter for screening for depression: Secondary | ICD-10-CM | POA: Diagnosis not present

## 2021-06-10 DIAGNOSIS — F419 Anxiety disorder, unspecified: Secondary | ICD-10-CM | POA: Diagnosis not present

## 2021-06-12 DIAGNOSIS — R6889 Other general symptoms and signs: Secondary | ICD-10-CM | POA: Diagnosis not present

## 2021-06-12 DIAGNOSIS — R051 Acute cough: Secondary | ICD-10-CM | POA: Diagnosis not present

## 2021-07-05 IMAGING — US US ABDOMEN LIMITED
1 series · 14 of 25 positions shown · non-contrast
Comparison: 08/15/2018

CLINICAL DATA: Follow-up gallbladder polyps.

EXAM:
ULTRASOUND ABDOMEN LIMITED RIGHT UPPER QUADRANT

[Series 1: us abdomen limited · 14 of 68 slices shown]
[im 1/68]
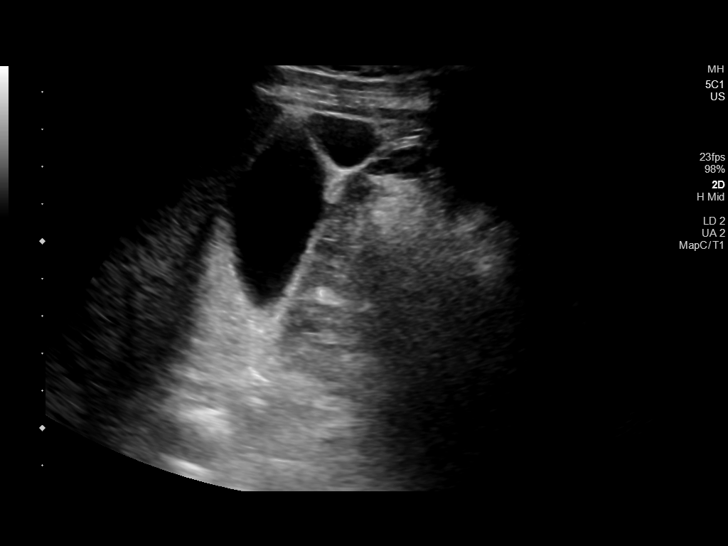
[im 6/68]
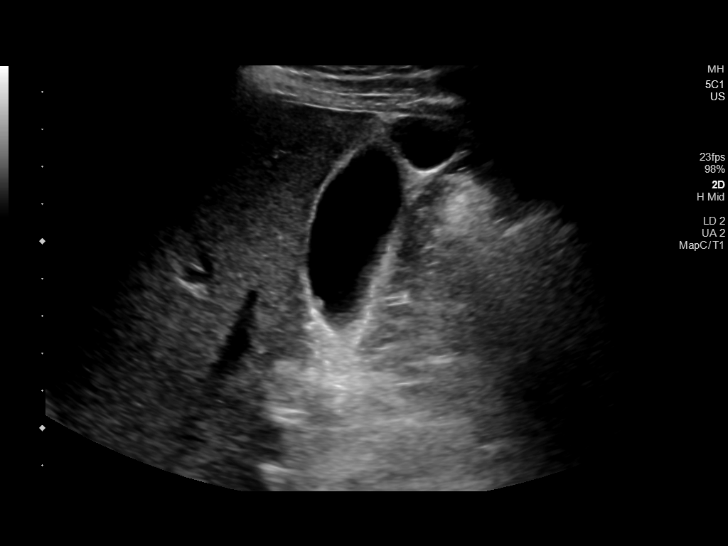
[im 12/68]
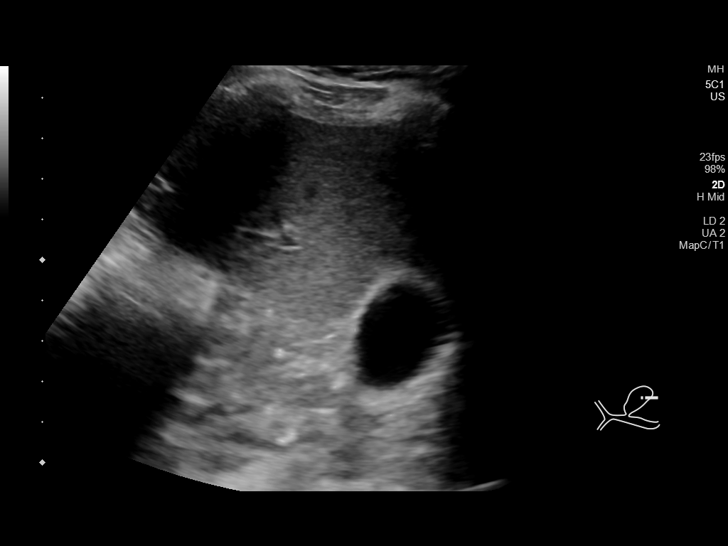
[im 17/68]
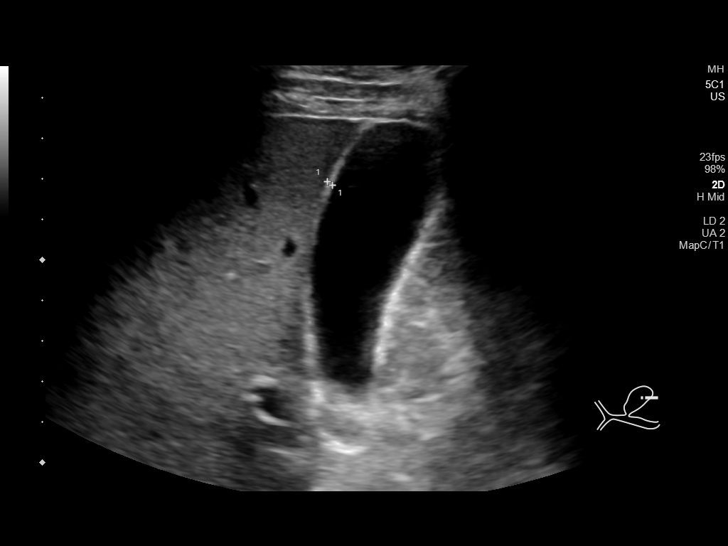
[im 23/68]
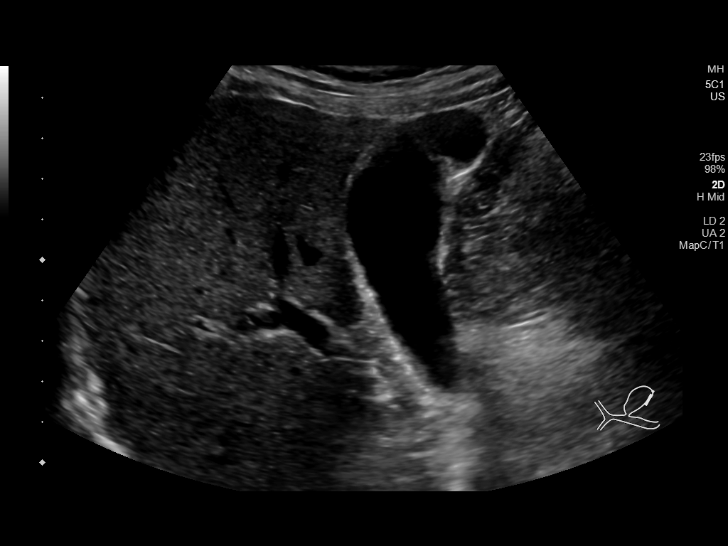
[im 26/68]
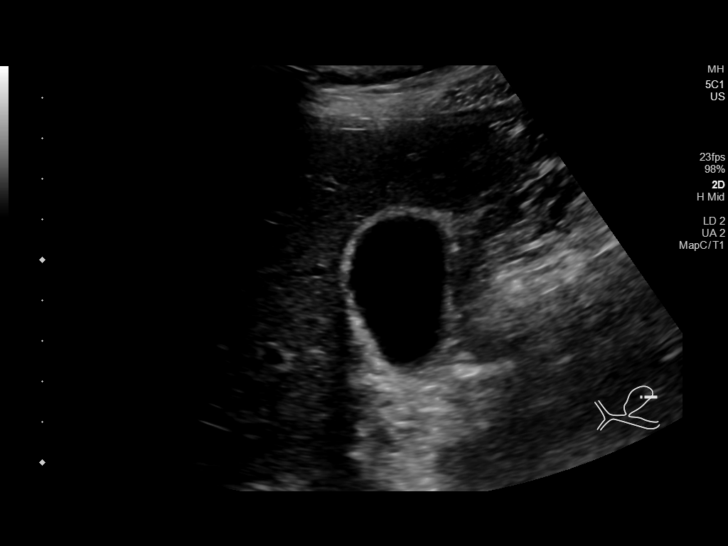
[im 31/68]
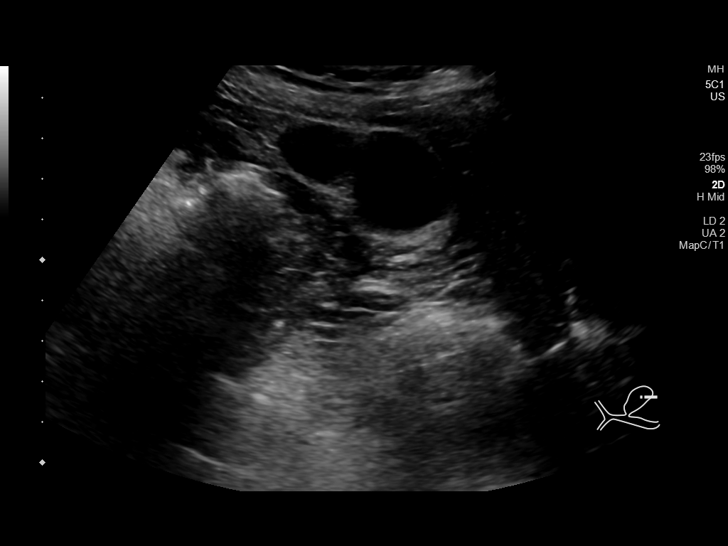
[im 37/68]
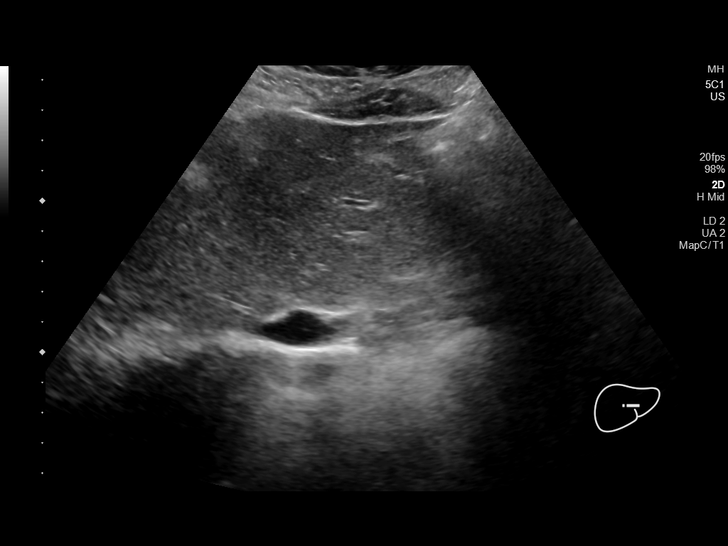
[im 42/68]
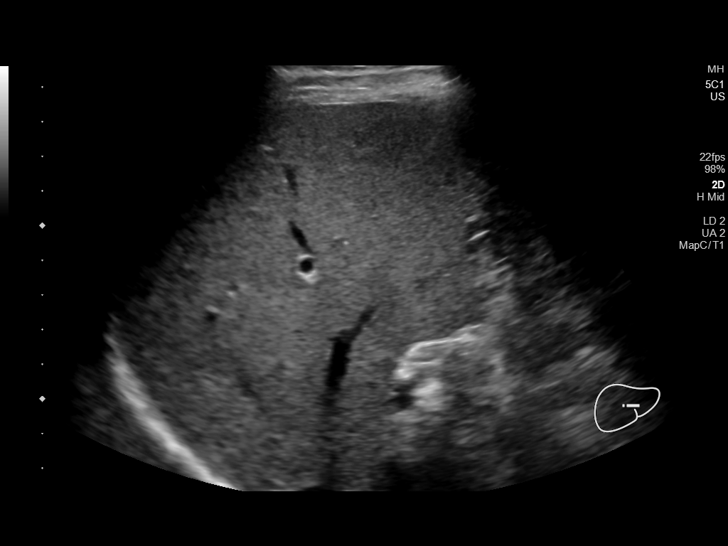
[im 45/68]
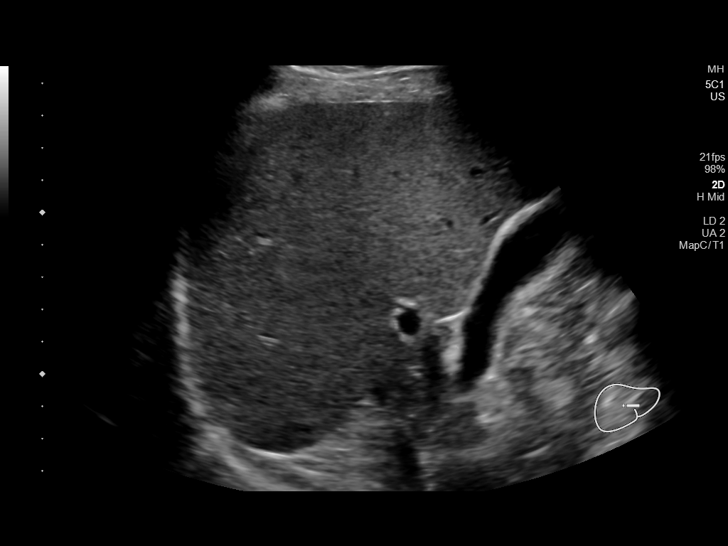
[im 51/68]
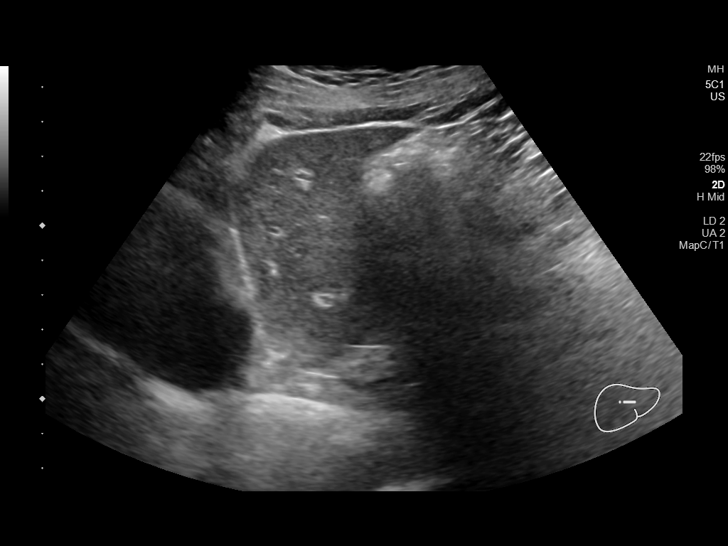
[im 56/68]
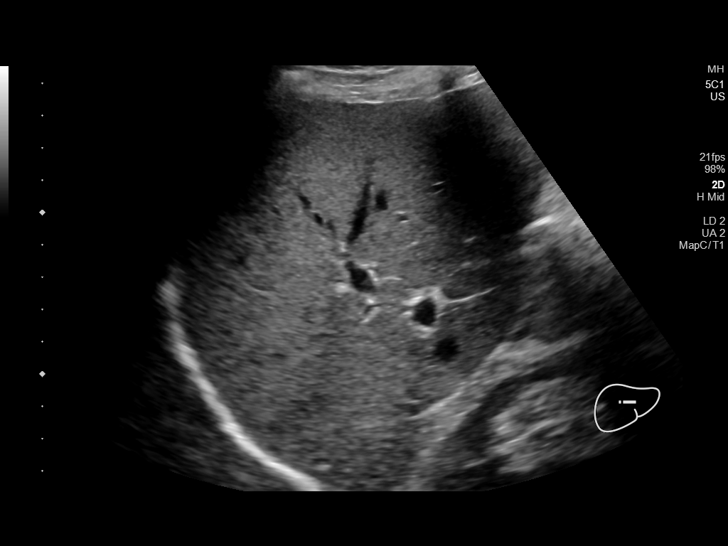
[im 62/68]
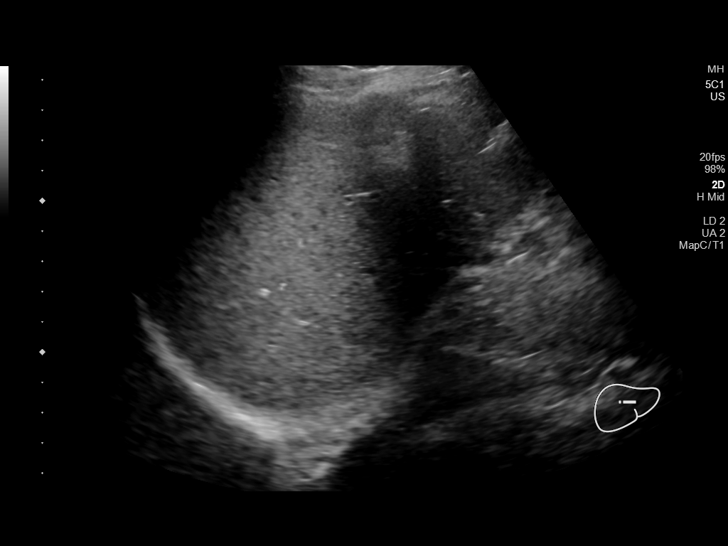
[im 68/68]
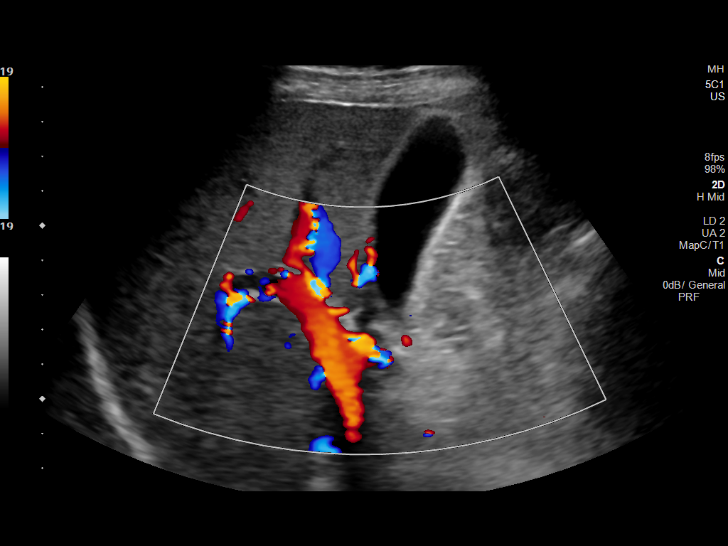

[14 of 25 positions shown; findings below may reference images not displayed]

FINDINGS: Gallbladder:

No gallstones or wall thickening visualized. A tiny non-mobile
gallbladder polyp is again seen measuring 2-3 mm. (no imaging
follow-up required). No sonographic Murphy sign noted by
sonographer.

Common bile duct:

Diameter: 3 mm, within normal limits.

Liver:

No focal lesion identified. Within normal limits in parenchymal
echogenicity. Portal vein is patent on color Doppler imaging with
normal direction of blood flow towards the liver.

Other: None.
IMPRESSION: No significant change in tiny 2-3 mm gallbladder polyp. No further
imaging follow-up required. (Reference: ACR consensus guidelines:
White Paper of the ACR Incidental Findings Committee II on
Gallbladder and Biliary Findings. [HOSPITAL]

No evidence of gallstones or biliary ductal dilatation.

## 2022-10-07 IMAGING — CR DG FINGER RING 2+V*L*
3 series · 3 of 3 positions shown · non-contrast
Comparison: None.

CLINICAL DATA: Crush injury to the fourth digit, initial encounter

EXAM:
LEFT RING FINGER 2+V

[x finger pa left]
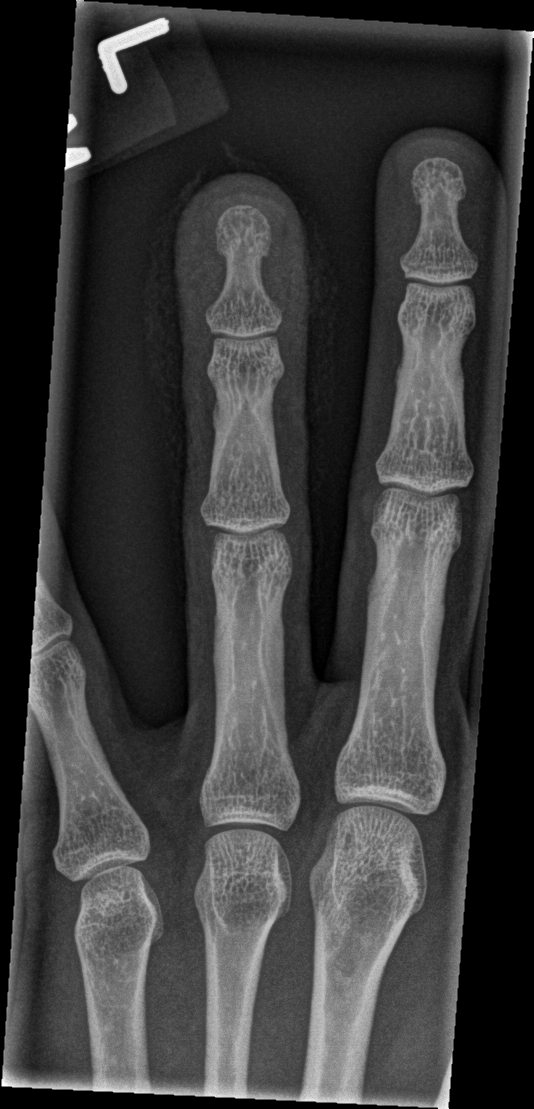

[x finger obl left]
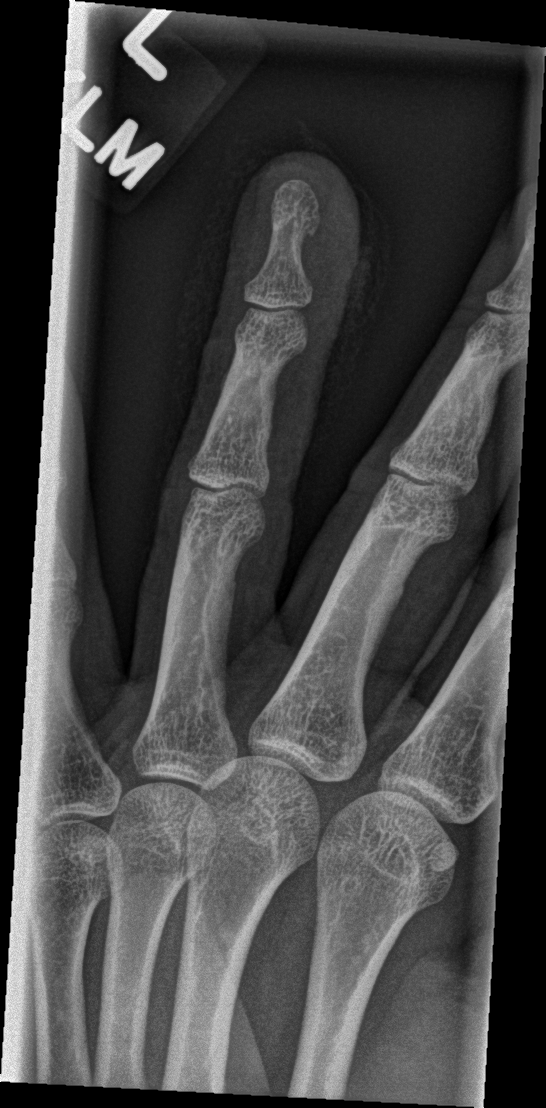

[x finger lat left]
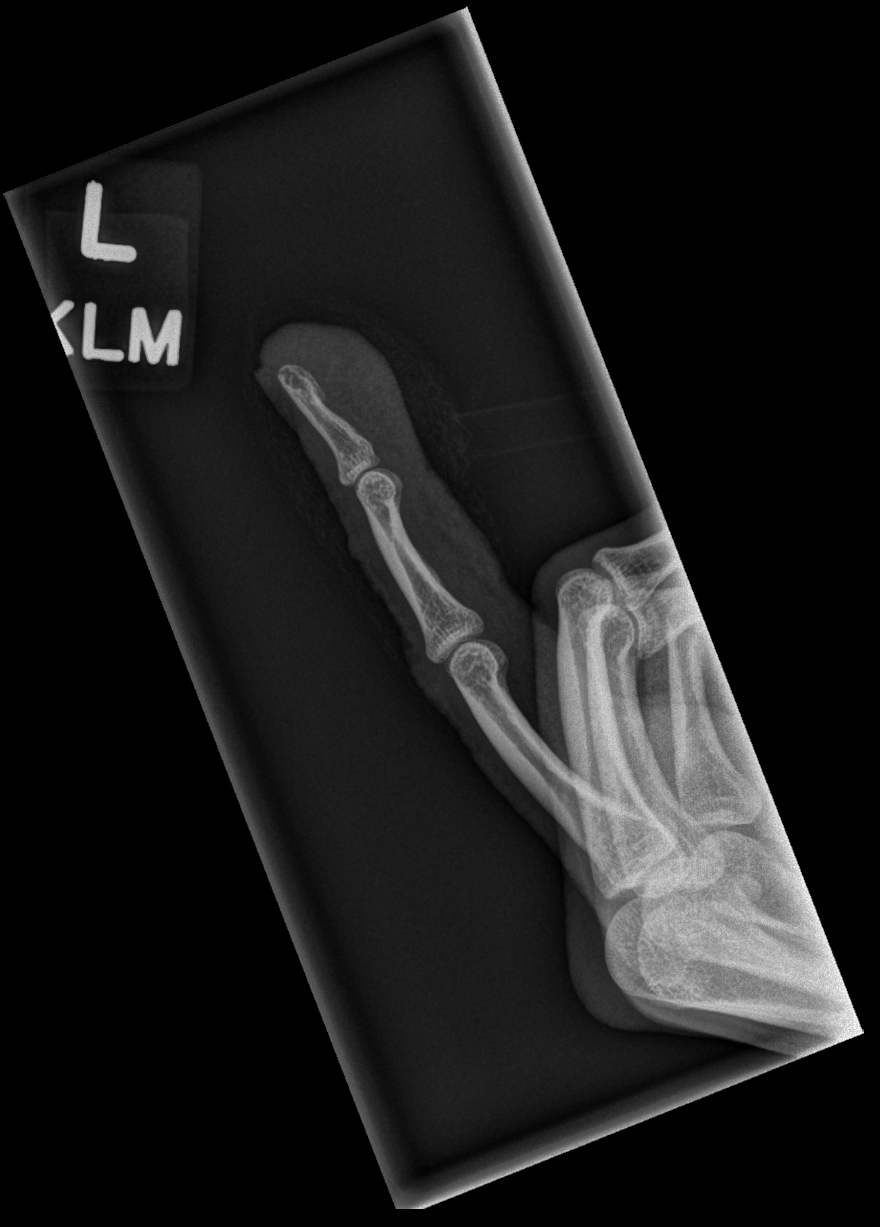

[3 of 3 positions shown; findings below may reference images not displayed]

FINDINGS: Soft tissue swelling is noted distally in the left fourth digit.
This is consistent with the given clinical history. No acute bony
abnormality is seen.
IMPRESSION: No acute bony abnormality is noted.
